# Patient Record
Sex: Female | Born: 2000 | Hispanic: No | Marital: Married | Smoking: Never smoker
Health system: Southern US, Community
[De-identification: ages and names within clinical notes are randomized; demographics above are authoritative.]

## PROBLEM LIST (undated history)

## (undated) DIAGNOSIS — Z789 Other specified health status: Secondary | ICD-10-CM

## (undated) HISTORY — DX: Other specified health status: Z78.9

## (undated) HISTORY — PX: NO PAST SURGERIES: SHX2092

---

## 2020-05-24 ENCOUNTER — Other Ambulatory Visit: Payer: Self-pay

## 2020-05-24 ENCOUNTER — Encounter (HOSPITAL_COMMUNITY): Payer: Self-pay | Admitting: Emergency Medicine

## 2020-05-24 ENCOUNTER — Ambulatory Visit (HOSPITAL_COMMUNITY)
Admission: EM | Admit: 2020-05-24 | Discharge: 2020-05-24 | Disposition: A | Discharge status: Home / Self Care | Payer: Medicaid Other | Attending: Family Medicine | Admitting: Family Medicine

## 2020-05-24 DIAGNOSIS — R300 Dysuria: Secondary | ICD-10-CM | POA: Diagnosis present

## 2020-05-24 DIAGNOSIS — B9689 Other specified bacterial agents as the cause of diseases classified elsewhere: Secondary | ICD-10-CM

## 2020-05-24 DIAGNOSIS — N760 Acute vaginitis: Secondary | ICD-10-CM | POA: Diagnosis present

## 2020-05-24 DIAGNOSIS — N938 Other specified abnormal uterine and vaginal bleeding: Secondary | ICD-10-CM

## 2020-05-24 LAB — POCT URINALYSIS DIPSTICK, ED / UC
Bilirubin Urine: NEGATIVE
Glucose, UA: NEGATIVE mg/dL
Ketones, ur: NEGATIVE mg/dL
Leukocytes,Ua: NEGATIVE
Nitrite: NEGATIVE
Protein, ur: NEGATIVE mg/dL
Specific Gravity, Urine: 1.025 (ref 1.005–1.030)
Urobilinogen, UA: 0.2 mg/dL (ref 0.0–1.0)
pH: 5 (ref 5.0–8.0)

## 2020-05-24 LAB — POC URINE PREG, ED: Preg Test, Ur: NEGATIVE

## 2020-05-24 MED ORDER — IBUPROFEN 600 MG PO TABS: 600.0000 mg | ORAL_TABLET | Freq: Four times a day (QID) | ORAL | 0 refills | Status: AC | PRN

## 2020-05-24 MED ORDER — METRONIDAZOLE 500 MG PO TABS: 500.0000 mg | ORAL_TABLET | Freq: Two times a day (BID) | ORAL | 0 refills | Status: DC

## 2020-05-24 NOTE — Discharge Instructions (Addendum)
Take the antibiotic 2 x a day for 7 days Take the ibuprofen as needed for pain Follow up with GYN  You will be called if any of the cultures are positive

## 2020-05-24 NOTE — ED Provider Notes (Signed)
[facility]    CSN: [id number] Arrival date & time: 05/24/20  0935      History   Chief Complaint No chief complaint on file.   HPI Angelica Ortega is a 19 y.o. female.   HPI   Patient states she was previously found to have a bacterial vaginal infection.  She states that she was on an unknown antibiotic.  She states she came to [place] prior to finishing treatment.  She has been [place] for 2 months.  She still has vaginal odor and discharge.  Also some dysuria.  No urinary frequency.  No flank or back pain.  No history of kidney stones or infection. She states she also has crampy lower abdominal pain and low back pain that comes and goes. Patient is also concerned that she has been married for 3 months and is not pregnant.  She is advised that this is not unusual.  Will be referred to GYN for follow-up care  History reviewed. No pertinent past medical history.  There are no problems to display for this patient.   History reviewed. No pertinent surgical history.  OB History   No obstetric history on file.      Home Medications    Prior to Admission medications   Medication Sig Start Date End Date Taking? Authorizing Provider  ibuprofen (ADVIL) 600 MG tablet Take 1 tablet (600 mg total) by mouth every 6 (six) hours as needed. 05/24/20   Eustace Moore, MD  metroNIDAZOLE (FLAGYL) 500 MG tablet Take 1 tablet (500 mg total) by mouth 2 (two) times daily. 05/24/20   Eustace Moore, MD    Family History Family History  Family history unknown: Yes    Social History Social History   Tobacco Use  . Smoking status: Never Smoker  . Smokeless tobacco: Never Used  Vaping Use  . Vaping Use: Never used  Substance Use Topics  . Alcohol use: Not Currently  . Drug use: Not on file     Allergies   Patient has no known allergies.   Review of Systems Review of Systems See HPI  Physical Exam Triage Vital Signs ED Triage Vitals  Enc Vitals  Group     BP 05/24/20 1045 (!) 98/54     Pulse Rate 05/24/20 1045 68     Resp 05/24/20 1045 17     Temp 05/24/20 1045 98.8 F (37.1 C)     Temp Source 05/24/20 1045 Oral     SpO2 05/24/20 1045 99 %     Weight --      Height --      Head Circumference --      Peak Flow --      Pain Score 05/24/20 1039 6     Pain Loc --      Pain Edu? --      Excl. in GC? --    No data found.  Updated Vital Signs BP (!) 98/54 (BP Location: Left Arm)   Pulse 68   Temp 98.8 F (37.1 C) (Oral)   Resp 17   LMP 05/20/2020   SpO2 99%       Physical Exam Constitutional:      General: She is not in acute distress.    Appearance: She is well-developed.     Comments: Mild overweight  HENT:     Head: Normocephalic and atraumatic.     Mouth/Throat:     Comments: Mask is in place Eyes:  Conjunctiva/sclera: Conjunctivae normal.     Pupils: Pupils are equal, round, and reactive to light.  Cardiovascular:     Rate and Rhythm: Normal rate.  Pulmonary:     Effort: Pulmonary effort is normal. No respiratory distress.  Abdominal:     General: There is no distension.     Palpations: Abdomen is soft.     Tenderness: There is no abdominal tenderness. There is no right CVA tenderness or left CVA tenderness.  Musculoskeletal:        General: Normal range of motion.     Cervical back: Normal range of motion.  Skin:    General: Skin is warm and dry.  Neurological:     Mental Status: She is alert.  Psychiatric:        Behavior: Behavior normal.      UC Treatments / Results  Labs (all labs ordered are listed, but only abnormal results are displayed) Labs Reviewed  POCT URINALYSIS DIPSTICK, ED / UC - Abnormal; Notable for the following components:      Result Value   Hgb urine dipstick MODERATE (*)    All other components within normal limits  URINE CULTURE  POC URINE PREG, ED  CERVICOVAGINAL ANCILLARY ONLY   Pregnancy test is negative    Medications Ordered in UC Medications - No  data to display  Initial Impression / Assessment and Plan / UC Course  I have reviewed the triage vital signs and the nursing notes.  Pertinent labs & imaging results that were available during my care of the patient were reviewed by me and considered in my medical decision making (see chart for details).     Patient complains of vaginal irritation and malodorous vaginal discharge with irregular uterine bleeding.  We will treat her with metronidazole twice a day for 7 days.  Have also sent a vaginal swab and urine culture to see if there is any other infectious disease.  Follow-up with GYN  Seen with Farsi/Persian interpreter Ditri Final Clinical Impressions(s) / UC Diagnoses   Final diagnoses:  Dysuria  BV (bacterial vaginosis)  DUB (dysfunctional uterine bleeding)     Discharge Instructions     Take the antibiotic 2 x a day for 7 days Take the ibuprofen as needed for pain Follow up with GYN  You will be called if any of the cultures are positive   ED Prescriptions    Medication Sig Dispense Auth. Provider   ibuprofen (ADVIL) 600 MG tablet Take 1 tablet (600 mg total) by mouth every 6 (six) hours as needed. 30 tablet Eustace Moore, MD   metroNIDAZOLE (FLAGYL) 500 MG tablet Take 1 tablet (500 mg total) by mouth 2 (two) times daily. 14 tablet Eustace Moore, MD     PDMP not reviewed this encounter.   Eustace Moore, MD 05/24/20 [phone]

## 2020-05-24 NOTE — ED Triage Notes (Addendum)
Using Farsi interpreter: Pt c/o dysuria with burning sensation x 2 years. Pt states in the last 5 months it has gotten worse. She arrived in the [place] 2 months ago and states this is the worse pain she has had with urinating. She denies bleeding but she has noticed some discharge. Pt states she is concerned that she cannot get pregnant. She states she has been trying to conceive and cannot.

## 2020-05-25 LAB — CERVICOVAGINAL ANCILLARY ONLY
Bacterial Vaginitis (gardnerella): NEGATIVE
Candida Glabrata: NEGATIVE
Candida Vaginitis: POSITIVE — AB
Chlamydia: NEGATIVE
Comment: NEGATIVE
Comment: NEGATIVE
Comment: NEGATIVE
Comment: NEGATIVE
Comment: NEGATIVE
Comment: NORMAL
Neisseria Gonorrhea: NEGATIVE
Trichomonas: NEGATIVE

## 2020-05-26 ENCOUNTER — Telehealth (HOSPITAL_COMMUNITY): Payer: Self-pay | Admitting: Emergency Medicine

## 2020-05-26 LAB — URINE CULTURE: Culture: 20000 — AB

## 2020-05-26 MED ORDER — NITROFURANTOIN MONOHYD MACRO 100 MG PO CAPS: 100.0000 mg | ORAL_CAPSULE | Freq: Two times a day (BID) | ORAL | 0 refills | Status: DC

## 2020-05-26 MED ORDER — FLUCONAZOLE 150 MG PO TABS: 150.0000 mg | ORAL_TABLET | Freq: Once | ORAL | 0 refills | Status: AC

## 2020-07-04 ENCOUNTER — Encounter: Payer: Self-pay | Admitting: Obstetrics and Gynecology

## 2020-07-04 ENCOUNTER — Other Ambulatory Visit: Payer: Self-pay

## 2020-07-04 ENCOUNTER — Ambulatory Visit (INDEPENDENT_AMBULATORY_CARE_PROVIDER_SITE_OTHER): Payer: Medicaid Other | Admitting: Obstetrics and Gynecology

## 2020-07-04 ENCOUNTER — Other Ambulatory Visit (HOSPITAL_COMMUNITY)
Admission: RE | Admit: 2020-07-04 | Discharge: 2020-07-04 | Disposition: A | Discharge status: Home / Self Care | Payer: Medicaid Other | Source: Ambulatory Visit | Attending: Obstetrics and Gynecology | Admitting: Obstetrics and Gynecology

## 2020-07-04 VITALS — BP 117/74 | HR 93 | Ht 64.0 in | Wt 219.4 lb

## 2020-07-04 DIAGNOSIS — R300 Dysuria: Secondary | ICD-10-CM | POA: Diagnosis not present

## 2020-07-04 DIAGNOSIS — N898 Other specified noninflammatory disorders of vagina: Secondary | ICD-10-CM | POA: Diagnosis present

## 2020-07-04 LAB — POCT URINALYSIS DIP (DEVICE)
Bilirubin Urine: NEGATIVE
Glucose, UA: NEGATIVE mg/dL
Ketones, ur: NEGATIVE mg/dL
Leukocytes,Ua: NEGATIVE
Nitrite: NEGATIVE
Protein, ur: NEGATIVE mg/dL
Specific Gravity, Urine: 1.025 (ref 1.005–1.030)
Urobilinogen, UA: 1 mg/dL (ref 0.0–1.0)
pH: 7.5 (ref 5.0–8.0)

## 2020-07-04 NOTE — Progress Notes (Signed)
  GYNECOLOGY PROGRESS NOTE  History:  Ms. Angelica Ortega is a 19 y.o. G0P0000 presents to [facility] office today for problem gyn visit. She reports painful urination, vaginal d/c, burning and itching.  She denies h/a, dizziness, shortness of breath, n/v, or fever/chills.    The following portions of the patient's history were reviewed and updated as appropriate: allergies, current medications, past family history, past medical history, past social history, past surgical history and problem list.  Review of Systems:  Pertinent items are noted in HPI.   Objective:  Physical Exam Blood pressure 117/74, pulse 93, height 5\' 4"  (1.626 m), weight 219 lb 6.4 oz (99.5 kg), last menstrual period 06/27/2020. VS reviewed, nursing note reviewed,  Constitutional: well developed, well nourished, no distress HEENT: normocephalic CV: normal rate Pulm/chest wall: normal effort Breast Exam: deferred Abdomen: soft Neuro: alert and oriented x 3 Skin: warm, dry Psych: affect normal Pelvic exam: Cervix pink, visually closed, without lesion, scant white creamy discharge, vaginal walls and external genitalia normal Bimanual exam: Cervix 0/long/high, firm, anterior, neg CMT, uterus nontender, nonenlarged, adnexa without tenderness, enlargement, or mass  Assessment & Plan:  1. Vaginal odor - Cervicovaginal ancillary only( [place])  2. Dysuria - Normal Urine dip - trace HgB  3. Vaginal discharge - Will treat according to results    06/29/2020, CNM 4:35 PM

## 2020-07-06 LAB — CERVICOVAGINAL ANCILLARY ONLY
Bacterial Vaginitis (gardnerella): NEGATIVE
Candida Glabrata: NEGATIVE
Candida Vaginitis: NEGATIVE
Comment: NEGATIVE
Comment: NEGATIVE
Comment: NEGATIVE
Comment: NEGATIVE
Trichomonas: NEGATIVE

## 2020-07-12 ENCOUNTER — Encounter: Payer: Self-pay | Admitting: Obstetrics and Gynecology

## 2020-07-12 ENCOUNTER — Telehealth: Payer: Self-pay | Admitting: Lactation Services

## 2020-07-12 NOTE — Telephone Encounter (Signed)
Called [employer] Dari Interpreter, Dari interpreter not available, will need to try to call again at another time.

## 2020-07-19 NOTE — Telephone Encounter (Signed)
I called Angelica Ortega with [employer] Sep [phone] and explained I am calling in response to her MyChart message. I reviewed her UA and wet prep results with her ( negative).  She asked if a medicine was ok to take to get pregnant. After clarification thru interpreter we discussed there is no medicine she is taking and that there is no medicine she should be taking to get pregnant. I did encourage her to take Prenatal Vitamins if she is trying to get pregnant. I also instructed her before taking any medicine to make sure if it is safe if she was to get pregnant. She voices understanding. Cortina Vultaggio,RN

## 2020-08-20 ENCOUNTER — Emergency Department (HOSPITAL_COMMUNITY)
Admission: EM | Admit: 2020-08-20 | Discharge: 2020-08-20 | Discharge status: Against Medical Advice | Payer: Medicaid Other

## 2020-08-23 ENCOUNTER — Emergency Department (HOSPITAL_COMMUNITY): Payer: Medicaid Other

## 2020-08-23 ENCOUNTER — Encounter (HOSPITAL_COMMUNITY): Payer: Self-pay | Admitting: Emergency Medicine

## 2020-08-23 ENCOUNTER — Other Ambulatory Visit: Payer: Self-pay

## 2020-08-23 ENCOUNTER — Emergency Department (HOSPITAL_COMMUNITY)
Admission: EM | Admit: 2020-08-23 | Discharge: 2020-08-23 | Disposition: A | Discharge status: Home / Self Care | Payer: Medicaid Other | Attending: Emergency Medicine | Admitting: Emergency Medicine

## 2020-08-23 DIAGNOSIS — N200 Calculus of kidney: Secondary | ICD-10-CM | POA: Diagnosis not present

## 2020-08-23 DIAGNOSIS — R1011 Right upper quadrant pain: Secondary | ICD-10-CM | POA: Diagnosis present

## 2020-08-23 LAB — BASIC METABOLIC PANEL
Anion gap: 11 (ref 5–15)
BUN: 9 mg/dL (ref 6–20)
CO2: 19 mmol/L — ABNORMAL LOW (ref 22–32)
Calcium: 9.2 mg/dL (ref 8.9–10.3)
Chloride: 106 mmol/L (ref 98–111)
Creatinine, Ser: 1.05 mg/dL — ABNORMAL HIGH (ref 0.44–1.00)
GFR, Estimated: >60 mL/min (ref >60)
Glucose, Bld: 131 mg/dL — ABNORMAL HIGH (ref 70–99)
Potassium: 4.2 mmol/L (ref 3.5–5.1)
Sodium: 136 mmol/L (ref 135–145)

## 2020-08-23 LAB — I-STAT BETA HCG BLOOD, ED (MC, WL, AP ONLY): I-stat hCG, quantitative: <5 m[IU]/mL (ref <5)

## 2020-08-23 LAB — CBC
HCT: 35.9 % — ABNORMAL LOW (ref 36.0–46.0)
Hemoglobin: 12.2 g/dL (ref 12.0–15.0)
MCH: 29.3 pg (ref 26.0–34.0)
MCHC: 34 g/dL (ref 30.0–36.0)
MCV: 86.3 fL (ref 80.0–100.0)
Platelets: 447 10*3/uL — ABNORMAL HIGH (ref 150–400)
RBC: 4.16 MIL/uL (ref 3.87–5.11)
RDW: 12.1 % (ref 11.5–15.5)
WBC: 13.4 10*3/uL — ABNORMAL HIGH (ref 4.0–10.5)
nRBC: 0 % (ref 0.0–0.2)

## 2020-08-23 LAB — URINALYSIS, MICROSCOPIC (REFLEX): Bacteria, UA: NONE SEEN

## 2020-08-23 LAB — URINALYSIS, ROUTINE W REFLEX MICROSCOPIC
Bilirubin Urine: NEGATIVE
Glucose, UA: 100 mg/dL — AB
Ketones, ur: NEGATIVE mg/dL
Nitrite: NEGATIVE
Protein, ur: NEGATIVE mg/dL
Specific Gravity, Urine: 1.025 (ref 1.005–1.030)
pH: 6.5 (ref 5.0–8.0)

## 2020-08-23 MED ORDER — TAMSULOSIN HCL 0.4 MG PO CAPS: 0.4000 mg | ORAL_CAPSULE | Freq: Every day | ORAL | 0 refills | Status: AC

## 2020-08-23 MED ORDER — OXYCODONE-ACETAMINOPHEN 5-325 MG PO TABS
1.0000 | ORAL_TABLET | ORAL | 0 refills | Status: AC | PRN
Start: 2020-08-23 — End: ?

## 2020-08-23 MED ORDER — OXYCODONE-ACETAMINOPHEN 5-325 MG PO TABS
1.0000 | ORAL_TABLET | Freq: Once | ORAL | Status: AC
  Administered 2020-08-23: 1 via ORAL
  Filled 2020-08-23: qty 1

## 2020-08-23 MED ORDER — CEPHALEXIN 500 MG PO CAPS: 500.0000 mg | ORAL_CAPSULE | Freq: Two times a day (BID) | ORAL | 0 refills | Status: AC

## 2020-08-23 MED ORDER — KETOROLAC TROMETHAMINE 30 MG/ML IJ SOLN
30.0000 mg | Freq: Once | INTRAMUSCULAR | Status: AC
  Administered 2020-08-23: 30 mg via INTRAMUSCULAR
  Filled 2020-08-23: qty 1

## 2020-08-23 MED ORDER — ACETAMINOPHEN 325 MG PO TABS
650.0000 mg | ORAL_TABLET | Freq: Once | ORAL | Status: AC
  Administered 2020-08-23: 650 mg via ORAL
  Filled 2020-08-23: qty 2

## 2020-08-23 NOTE — ED Notes (Signed)
Reviewed discharge instructions with patient and friend. Follow-up care and medications reviewed. Patient and friend verbalized understanding. Patient A&Ox4, VSS, and ambulatory with steady gait upon discharge.  

## 2020-08-23 NOTE — ED Provider Notes (Signed)
[facility] EMERGENCY DEPARTMENT Provider Note   CSN: [id number] Arrival date & time: 08/23/20  5035     History Chief Complaint  Patient presents with  . Flank Pain   History provided by the patient with the assistance of Chad translator [phone].  Angelica Ortega is a 20 y.o. female who presents with 1 week of burning with urination, now with 4 days of left flank pain that began Friday afternoon.  Patient has history of urinary tract infections and additionally has history of kidney stone, however these records are not available patient as recently emigrated from [place].  Patient states that she arrived few months ago from her home city of [place], [place].  She denies any hematuria but endorses urinary urgency and frequency.  Additionally endorses radiation of her flank pain around the left side of her abdomen, endorses pain over her bladder, worse with urination.  Patient states she was married 8 months ago and has been sexually active since that time.  Denies any vaginal bleeding or new vaginal discharge.  Denies any dyspareunia.  She has never been pregnant.  Patient endorses significant history of constipation, for which she was seen at urgent care 3 days ago.  She was prescribed mag citrate and Dulcolax, which he states she has been taking with improvement in her bowel movements.  I personally reviewed this patient's medical records.  She has history of constipation and urinary tract infection/nephrolithiasis, however otherwise carries no other medical diagnoses and is not on any medications every day.  HPI     Past Medical History:  Diagnosis Date  . Medical history non-contributory     There are no problems to display for this patient.   Past Surgical History:  Procedure Laterality Date  . NO PAST SURGERIES       OB History    Gravida  0   Para  0   Term  0   Preterm  0   AB  0   Living  0     SAB  0   IAB  0   Ectopic  0    Multiple  0   Live Births  0           Family History  Family history unknown: Yes    Social History   Tobacco Use  . Smoking status: Never Smoker  . Smokeless tobacco: Never Used  Vaping Use  . Vaping Use: Never used  Substance Use Topics  . Alcohol use: Not Currently    Home Medications Prior to Admission medications   Medication Sig Start Date End Date Taking? Authorizing Provider  cephALEXin (KEFLEX) 500 MG capsule Take 1 capsule (500 mg total) by mouth 2 (two) times daily for 5 days. 08/23/20 08/28/20 Yes Corbitt Cloke R, PA-C  ondansetron (ZOFRAN-ODT) 4 MG disintegrating tablet Take 4 mg by mouth every 6 (six) hours as needed for nausea or vomiting. 08/20/20  Yes [provider]  oxyCODONE-acetaminophen (PERCOCET/ROXICET) 5-325 MG tablet Take 1 tablet by mouth every 4 (four) hours as needed for severe pain. 08/23/20  Yes Dannette Kinkaid, Eugene Gavia, PA-C  tamsulosin (FLOMAX) 0.4 MG CAPS capsule Take 1 capsule (0.4 mg total) by mouth daily. 08/23/20  Yes Chenika Nevils R, PA-C  ibuprofen (ADVIL) 600 MG tablet Take 1 tablet (600 mg total) by mouth every 6 (six) hours as needed. Patient not taking: No sig reported 05/24/20   Eustace Moore, MD    Allergies    5-alpha reductase inhibitors  Review of Systems   Review of Systems  Constitutional: Negative.   HENT: Negative.   Eyes: Negative.   Respiratory: Negative.   Cardiovascular: Negative.   Gastrointestinal: Positive for abdominal pain. Negative for diarrhea, nausea and vomiting.  Genitourinary: Positive for dysuria, flank pain, frequency and urgency. Negative for decreased urine volume, difficulty urinating, dyspareunia, hematuria, menstrual problem, pelvic pain, vaginal bleeding, vaginal discharge and vaginal pain.  Skin: Negative.   Neurological: Negative.   Hematological: Negative.     Physical Exam Updated Vital Signs BP 113/64   Pulse 66   Temp 98.3 F (36.8 C) (Oral)   Resp (!) 23   SpO2  98%   Physical Exam Vitals and nursing note reviewed.  Constitutional:      Appearance: She is overweight.  HENT:     Head: Normocephalic and atraumatic.     Nose: Nose normal.     Mouth/Throat:     Mouth: Mucous membranes are moist.     Pharynx: Oropharynx is clear. Uvula midline. No oropharyngeal exudate, posterior oropharyngeal erythema or uvula swelling.     Tonsils: No tonsillar exudate.  Eyes:     General: Lids are normal. Vision grossly intact.        Right eye: No discharge.        Left eye: No discharge.     Extraocular Movements: Extraocular movements intact.     Conjunctiva/sclera: Conjunctivae normal.     Pupils: Pupils are equal, round, and reactive to light.  Neck:     Trachea: Trachea and phonation normal.  Cardiovascular:     Rate and Rhythm: Normal rate and regular rhythm.     Pulses: Normal pulses.          Dorsalis pedis pulses are 2+ on the right side and 2+ on the left side.     Heart sounds: Normal heart sounds. No murmur heard.   Pulmonary:     Effort: Pulmonary effort is normal. No prolonged expiration or respiratory distress.     Breath sounds: Normal breath sounds. No wheezing or rales.  Chest:     Chest wall: No deformity, swelling, tenderness, crepitus or edema.  Abdominal:     General: Bowel sounds are normal. There is no distension.     Palpations: Abdomen is soft.     Tenderness: There is abdominal tenderness in the right upper quadrant, right lower quadrant and suprapubic area. There is left CVA tenderness. There is no right CVA tenderness, guarding or rebound. Negative signs include Murphy's sign and Rovsing's sign.  Musculoskeletal:        General: No deformity.     Cervical back: Neck supple. No rigidity or crepitus. No pain with movement or spinous process tenderness.     Right lower leg: No edema.     Left lower leg: No edema.  Lymphadenopathy:     Cervical: No cervical adenopathy.  Skin:    General: Skin is warm and dry.      Capillary Refill: Capillary refill takes less than 2 seconds.  Neurological:     General: No focal deficit present.     Mental Status: She is alert and oriented to person, place, and time.     Sensory: Sensation is intact.     Motor: Motor function is intact.     Gait: Gait is intact.  Psychiatric:        Mood and Affect: Mood normal.     ED Results / Procedures / Treatments   Labs (all  labs ordered are listed, but only abnormal results are displayed) Labs Reviewed  URINALYSIS, ROUTINE W REFLEX MICROSCOPIC - Abnormal; Notable for the following components:      Result Value   APPearance HAZY (*)    Glucose, UA 100 (*)    Hgb urine dipstick TRACE (*)    Leukocytes,Ua TRACE (*)    All other components within normal limits  CBC - Abnormal; Notable for the following components:   WBC 13.4 (*)    HCT 35.9 (*)    Platelets 447 (*)    All other components within normal limits  BASIC METABOLIC PANEL - Abnormal; Notable for the following components:   CO2 19 (*)    Glucose, Bld 131 (*)    Creatinine, Ser 1.05 (*)    All other components within normal limits  URINALYSIS, MICROSCOPIC (REFLEX)  I-STAT BETA HCG BLOOD, ED ([facility], [facility], [facility] ONLY)    EKG None  Radiology CT Renal Stone Study  Result Date: 08/23/2020 CLINICAL DATA:  Left-sided flank pain for several days EXAM: CT ABDOMEN AND PELVIS WITHOUT CONTRAST TECHNIQUE: Multidetector CT imaging of the abdomen and pelvis was performed following the standard protocol without IV contrast. COMPARISON:  None. FINDINGS: Lower chest: No acute abnormality. Hepatobiliary: No focal liver abnormality is seen. No gallstones, gallbladder wall thickening, or biliary dilatation. Pancreas: Unremarkable. No pancreatic ductal dilatation or surrounding inflammatory changes. Spleen: Normal in size without focal abnormality. Adrenals/Urinary Tract: Adrenal glands are within normal limits. Right kidney is unremarkable. Left kidney demonstrates fullness of the  collecting system and left ureter which extends to the level of the left UVJ at which point a small 2-3 mm stone is noted causing the obstructive change. Bladder is partially distended. Stomach/Bowel: Appendix is within normal limits. No obstructive or inflammatory changes of the colon are seen. Small bowel and stomach are unremarkable. Vascular/Lymphatic: No significant vascular findings are present. No enlarged abdominal or pelvic lymph nodes. Reproductive: Uterus and bilateral adnexa are unremarkable. Other: No abdominal wall hernia or abnormality. No abdominopelvic ascites. Musculoskeletal: No acute or significant osseous findings. IMPRESSION: Small distal left ureteral stone causing obstructive change. No other focal abnormality is noted. Electronically Signed   By: Alcide Clever M.D.   On: 08/23/2020 13:02    Procedures Procedures   Medications Ordered in ED Medications  acetaminophen (TYLENOL) tablet 650 mg (650 mg Oral Given 08/23/20 1205)  ketorolac (TORADOL) 30 MG/ML injection 30 mg (30 mg Intramuscular Given 08/23/20 1443)  oxyCODONE-acetaminophen (PERCOCET/ROXICET) 5-325 MG per tablet 1 tablet (1 tablet Oral Given 08/23/20 1443)    ED Course  I have reviewed the triage vital signs and the nursing notes.  Pertinent labs & imaging results that were available during my care of the patient were reviewed by me and considered in my medical decision making (see chart for details).    MDM Rules/Calculators/A&P                         20 year old female with history of nephrolithiasis and UTIs who presents today with 1 week of burning with urination and 4 days of left flank pain that radiates to her lower abdomen  The differential diagnosis of emergent flank pain includes, but is not limited to: Nephrolithiasis/ Renal Colic, Pyelonephritis, Abdominal aortic aneurysm, Aortic dissection, Renal artery embolism, Renal vein thrombosis, Renal infarction, Renal hemorrhage, Mesenteric ischemia, Bladder  tumor, Cystitis, Biliary colic, Pancreatitis, Perforated peptic ulcer,  Appendicitis, Inguinal Hernia, Diverticulitis, Bowel obstruction. Shingles, Lower lobe  pneumonia, Retroperitoneal hematoma/abscess/tumor, Epidural abscess, Epidural hematoma.  Particularly in females it is important to consider Ectopic Pregnancy,PID/TOA,Ovarian cyst, Ovarian torsion, STD.  Hypertensive on intake to 140/93.  Vital signs otherwise normal.  Cardiopulmonary exam is normal, abdominal exam revealed the left CVA tenderness and tenderness palpation of the right side of the abdomen and suprapubic area without rebound or guarding.  Patient is neurovascularly intact in all 4 extremities.  She is not toxic appearing.  Basic laboratory studies obtained in triage.  CBC with mild cytosis of 13.4, BMP with mild elevation in creatinine to 1.05.  Patient is not pregnant, UA revealed trace hemoglobin trace leukocytes and no bacteria, which is reassuring.  Will proceed with CT renal study.  Analgesia offered.  CT renal study revealed 3 mm stone at the left UVJ with obstructive changes of with mild fullness of the left kidney and ureter.  Mild distention of the bladder.  Results of this patient's test results were discussed with patient again using Chad translator [phone].  Given patient's urinary symptoms preceded onset of flank pain history of recurrent urinary tract infections will proceed with antibiotic therapy at this time.  Patient was discharged with prescription for Flomax and pain medication.  Joani voiced understanding of her medical evaluation and treatment plan.  Each of her questions was answered to her expressed satisfaction.  Return precautions given.  Patient is stable and appropriate for discharge at this time.  This chart was dictated using voice recognition software, Dragon. Despite the best efforts of this provider to proofread and correct errors, errors may still occur which can change documentation  meaning.  Final Clinical Impression(s) / ED Diagnoses Final diagnoses:  Nephrolithiasis    Rx / DC Orders ED Discharge Orders         Ordered    oxyCODONE-acetaminophen (PERCOCET/ROXICET) 5-325 MG tablet  Every 4 hours PRN        08/23/20 1431    tamsulosin (FLOMAX) 0.4 MG CAPS capsule  Daily        08/23/20 1431    cephALEXin (KEFLEX) 500 MG capsule  2 times daily        08/23/20 [address] Idelia Salm 08/23/20 Florian Buff, MD 08/29/20 1114

## 2020-08-23 NOTE — ED Triage Notes (Signed)
Pt arrives to ED with chief complaint of left flank pain that began Friday at 4pm.  Associated with burning with urination. Remote hx of UTI had positive urine culture 11/21 and was treated at that time. Also history of kidney stone.

## 2020-08-23 NOTE — Discharge Instructions (Addendum)
Angelica Ortega was seen in the emergency department today for her back and belly pain. She was found to have a kidney stone. Her blood work, physical exam, and imaging is very reassuring. There are no signs of infection in your blood or your urine at this time.  You have been prescribed 3 medications. An antibiotic due to the symptoms you are having with burning when having with urination, to prevent infection. You should take this as prescribed for the entire course. Additionally you have been prescribed a medication called Flomax, which can help you urinate out your kidney stone. Thirdly you have been prescribed a narcotic pain medication which may take as needed for your pain. Medication has Tylenol and incisional take any additional Tylenol. You may take ibuprofen in addition to this medication.  Return to emergency department if your pain is worsening Improved after several days, if you develop any nausea or vomiting that does not stop, or any other new severe symptoms.  Below is the contact information for [facility], under my service your primary care doctor while you are working to get established in the area.   ?????? ????? ?? ???? ??? ??? ? ??? ?? ??????? ????? ??. ???? ?? ?? ?? ??? ???? ????. ?????? ???? ?????? ?????? ? ??????????? ?? ????? ??????? ??? ???. ?? ??? ???? ??? ????? ?? ?? ????? ?? ??? ?? ????? ??? ???? ?????.  ???? ??? 3 ???? ????? ??? ???. ?? ???? ?????? ?? ???? ?????? ?? ?? ????? ???? ????? ????? ?????? ???? ??????? ?? ?????. ??? ???? ??? ?? ??????? ?? ???? ?? ???? ????? ??? ??? ???? ????. ????? ?? ???? ?????? ?? ??? ??????? ???? ??? ????? ??? ??? ?? ?? ????? ?? ??? ?? ??? ??? ???? ??? ???. ?????? ?? ????? ???? ???? ??? ????? ??? ??? ?? ???? ??? ?? ???? ???? ???? ??? ??? ???? ???. ???? ????? ??????? ??? ? ??????? ????? ?? ?? ???? ???? ???? ????. ?? ?????? ????? ?? ??? ???? ?? ????????? ??? ??????? ????.  ?? ???? ????? ??? ?? ??????? ???????? ?? ?? ???  ???? ?? ???? ???? ???? ?? ??????? ?? ????? ??? ???? ?? ????? ???? ???? ????? ????? ?? ????.  ?? ??? ??????? ???? ?????? ????? ? ??????? ????? ????? ???? ???? ?? ?? ??? ??? ???? ??????? ?? ?? ????? ??? ????? ???? ?????? ????? ??? ???? ????.

## 2020-08-23 NOTE — ED Notes (Signed)
Pt transported to CT at this time.

## 2020-09-14 ENCOUNTER — Telehealth: Payer: Self-pay | Admitting: General Practice

## 2020-09-14 NOTE — Telephone Encounter (Signed)
Called patient sponsor advising her that patient appointment had been cancelled due to the provider being out of the office and that she would need to reschedule. Advised patient to call [phone] to reschedule.

## 2020-09-15 ENCOUNTER — Inpatient Hospital Stay: Payer: Medicaid Other | Admitting: Physician Assistant

## 2020-10-13 ENCOUNTER — Inpatient Hospital Stay: Payer: Medicaid Other | Admitting: Physician Assistant

## 2022-01-24 IMAGING — CT CT RENAL STONE PROTOCOL
2 of 4 series · 16 of 46 positions shown, 18 images · non-contrast
Comparison: None.

CLINICAL DATA: Left-sided flank pain for several days

EXAM:
CT ABDOMEN AND PELVIS WITHOUT CONTRAST
TECHNIQUE: Multidetector CT imaging of the abdomen and pelvis was performed
following the standard protocol without IV contrast.

[Series 3: renal stone 5.0 · axial · 0.96mm/px · z∈[+906,+1416]mm · 13 of 112 slices shown, 15 images]
[im 5/112  soft-tissue]
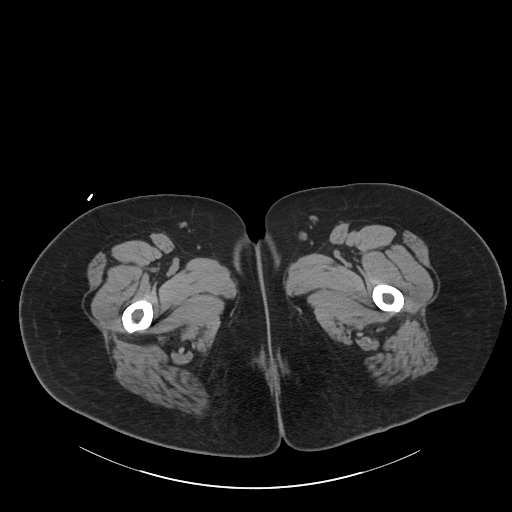
[im 5/112  bone]
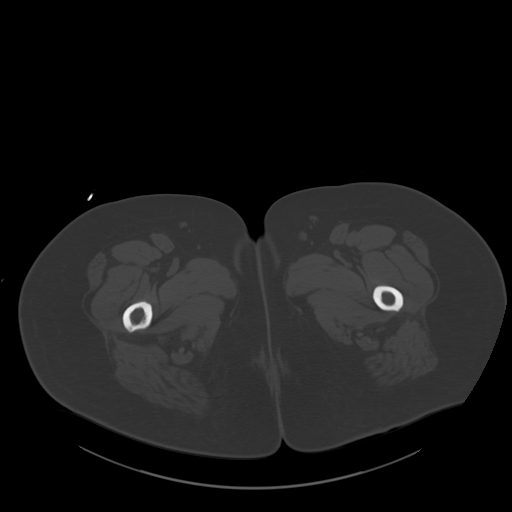
[im 14/112  soft-tissue]
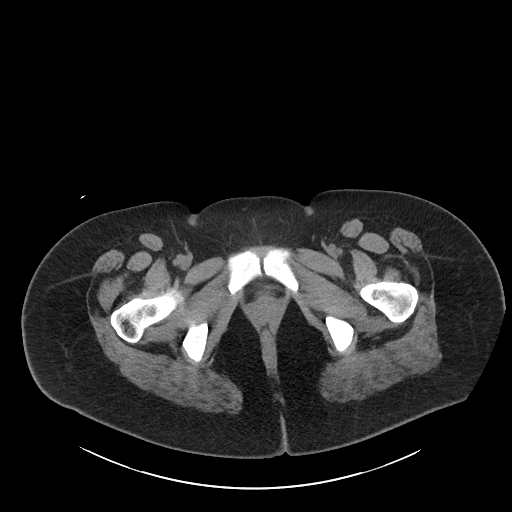
[im 23/112  soft-tissue]
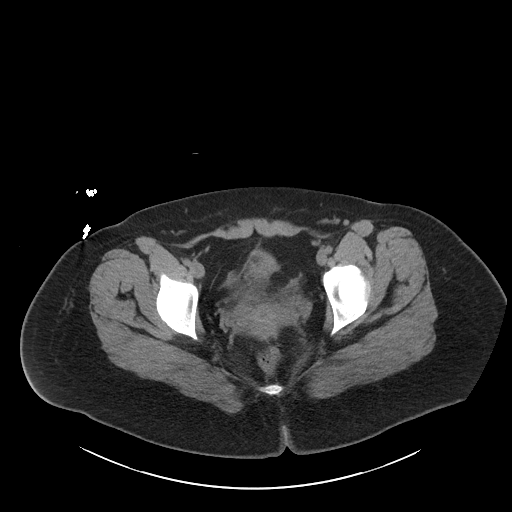
[im 32/112  soft-tissue]
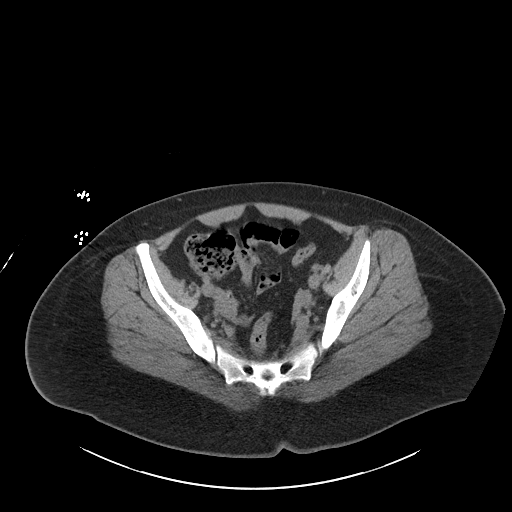
[im 40/112  soft-tissue]
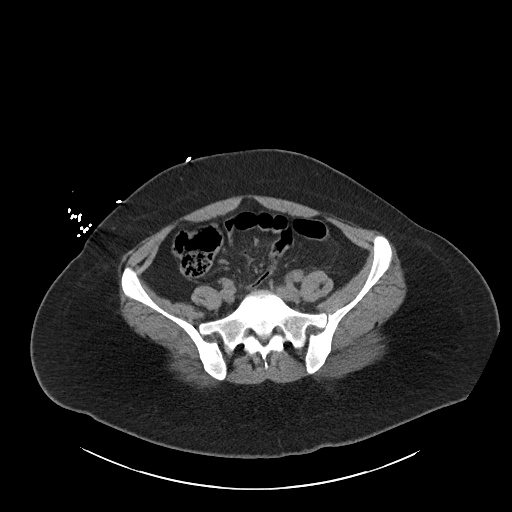
[im 49/112  soft-tissue]
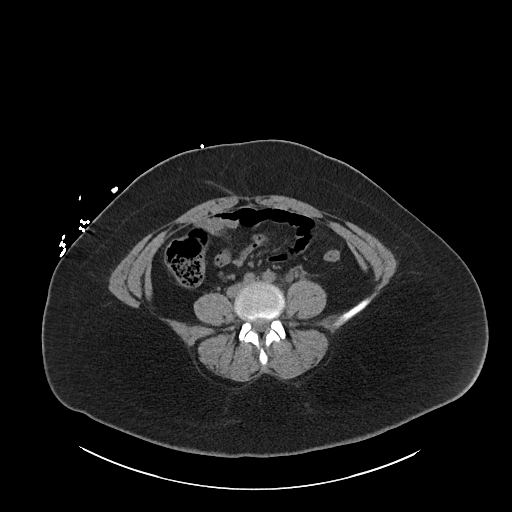
[im 58/112  soft-tissue]
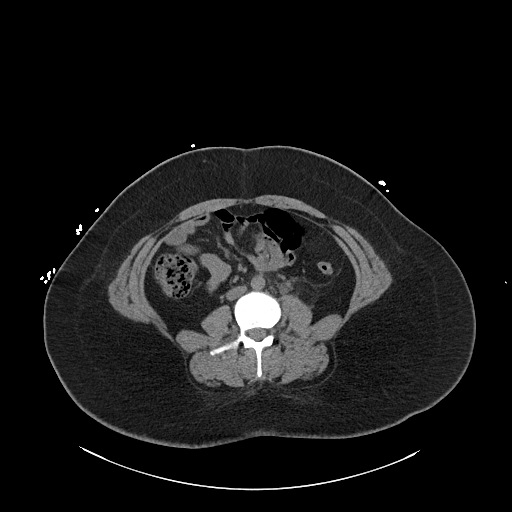
[im 63/112  soft-tissue]
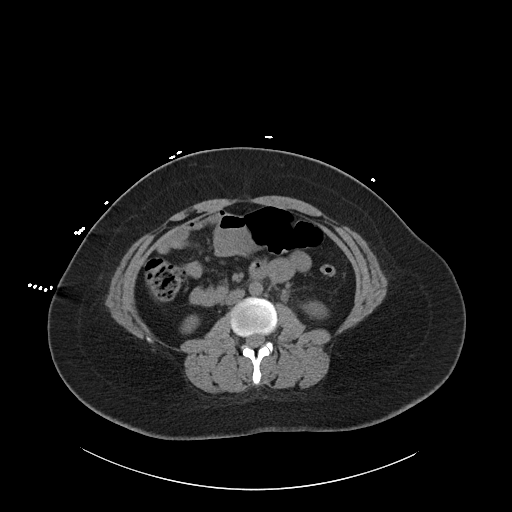
[im 72/112  soft-tissue]
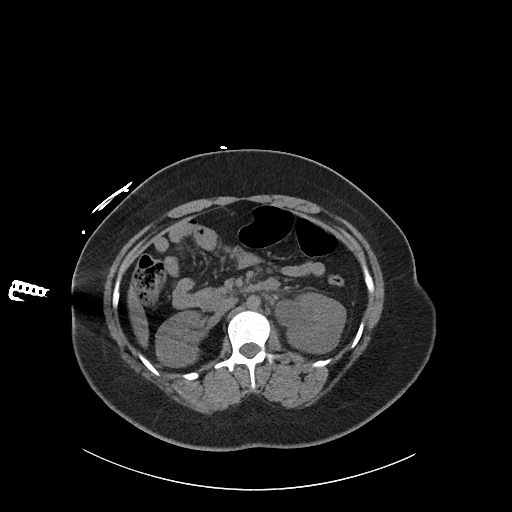
[im 72/112  bone]
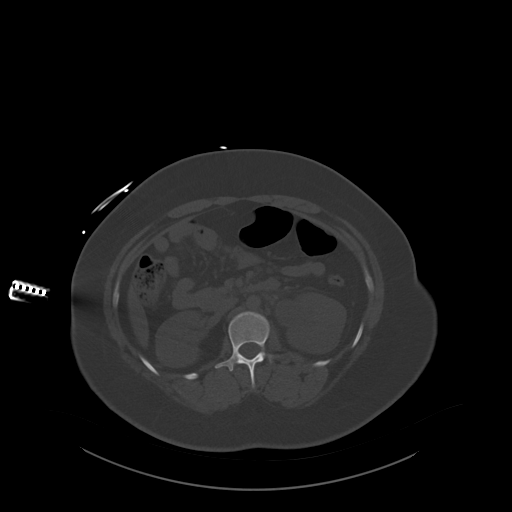
[im 80/112  soft-tissue]
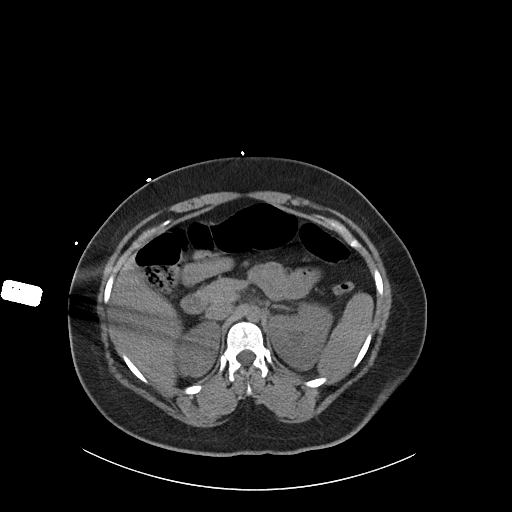
[im 89/112  soft-tissue]
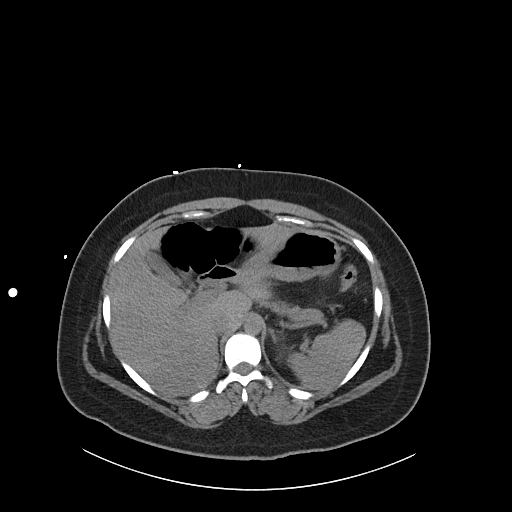
[im 98/112  soft-tissue]
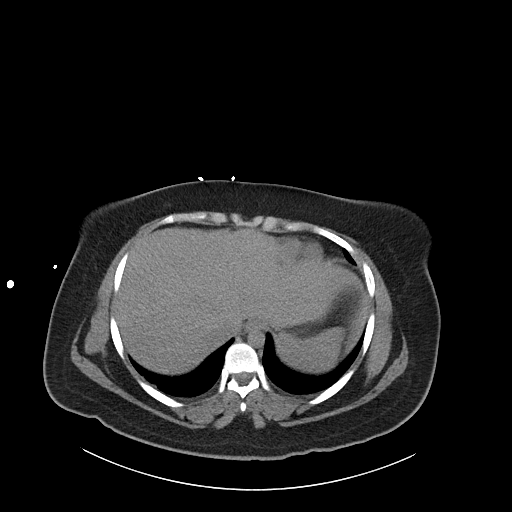
[im 107/112  soft-tissue]
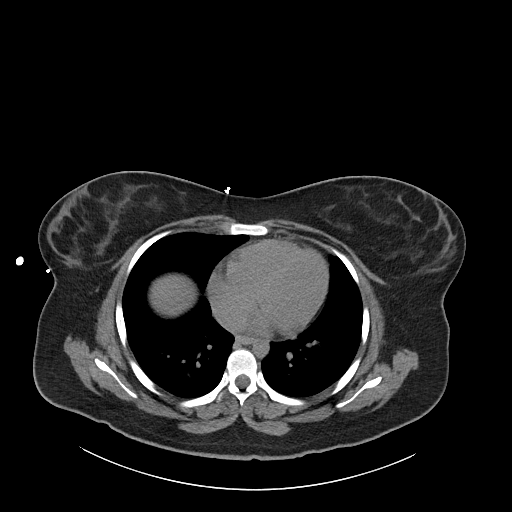

[Series 6: coronal · coronal · 0.96mm/px · 3 of 103 slices shown]
[im 35/103  soft-tissue]
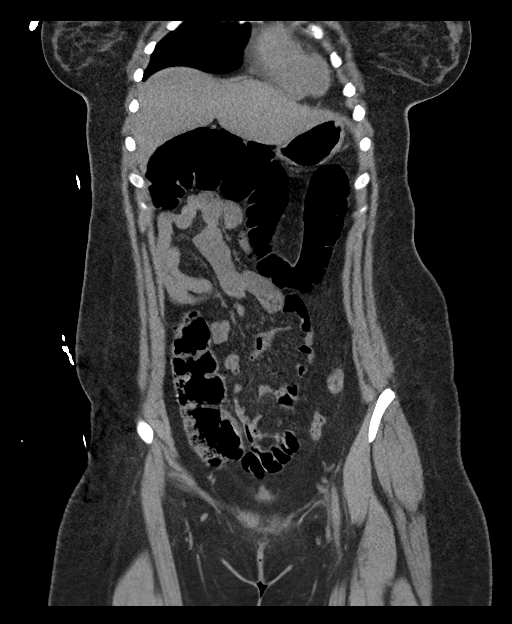
[im 46/103  soft-tissue]
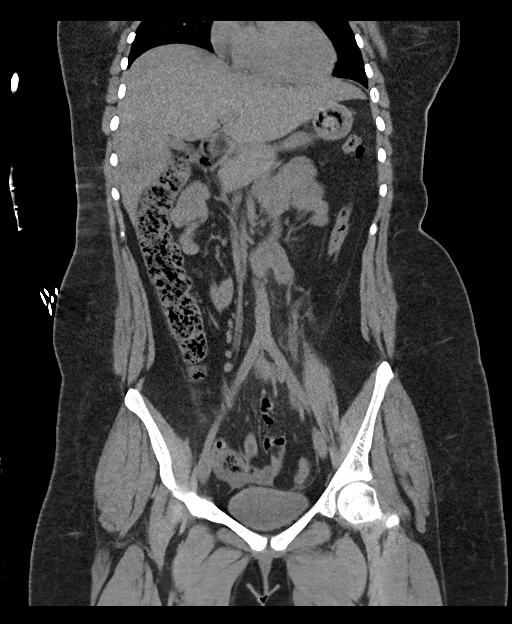
[im 57/103  soft-tissue]
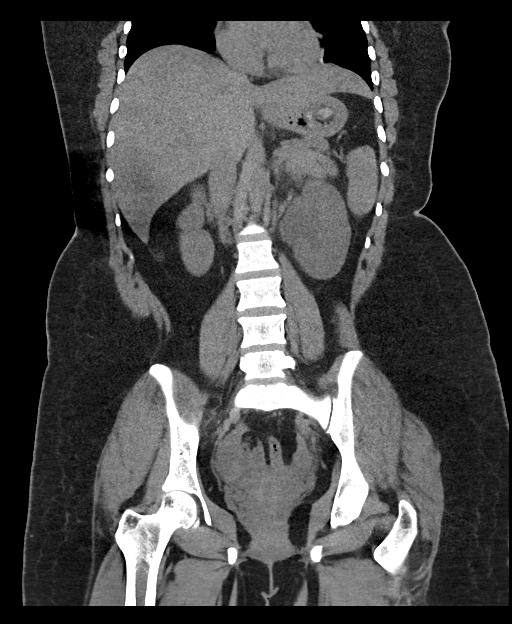

[16 of 46 positions shown; findings below may reference images not displayed]

FINDINGS: Lower chest: No acute abnormality.

Hepatobiliary: No focal liver abnormality is seen. No gallstones,
gallbladder wall thickening, or biliary dilatation.

Pancreas: Unremarkable. No pancreatic ductal dilatation or
surrounding inflammatory changes.

Spleen: Normal in size without focal abnormality.

Adrenals/Urinary Tract: Adrenal glands are within normal limits.
Right kidney is unremarkable. Left kidney demonstrates fullness of
the collecting system and left ureter which extends to the level of
the left UVJ at which point a small 2-3 mm stone is noted causing
the obstructive change. Bladder is partially distended.

Stomach/Bowel: Appendix is within normal limits. No obstructive or
inflammatory changes of the colon are seen. Small bowel and stomach
are unremarkable.

Vascular/Lymphatic: No significant vascular findings are present. No
enlarged abdominal or pelvic lymph nodes.

Reproductive: Uterus and bilateral adnexa are unremarkable.

Other: No abdominal wall hernia or abnormality. No abdominopelvic
ascites.

Musculoskeletal: No acute or significant osseous findings.
IMPRESSION: Small distal left ureteral stone causing obstructive change.

No other focal abnormality is noted.
